# Patient Record
Sex: Female | Born: 1965 | Hispanic: Yes | Marital: Single | Smoking: Never smoker
Health system: Southern US, Community
[De-identification: ages and names within clinical notes are randomized; demographics above are authoritative.]

## PROBLEM LIST (undated history)

## (undated) DIAGNOSIS — E079 Disorder of thyroid, unspecified: Secondary | ICD-10-CM

## (undated) HISTORY — PX: ABDOMINAL HYSTERECTOMY: SHX81

---

## 2018-02-13 ENCOUNTER — Encounter: Payer: Self-pay | Admitting: Emergency Medicine

## 2018-02-13 ENCOUNTER — Emergency Department
Admission: EM | Admit: 2018-02-13 | Discharge: 2018-02-13 | Disposition: A | Discharge status: Home / Self Care | Payer: Self-pay | Attending: Emergency Medicine | Admitting: Emergency Medicine

## 2018-02-13 ENCOUNTER — Emergency Department: Payer: Self-pay

## 2018-02-13 ENCOUNTER — Other Ambulatory Visit: Payer: Self-pay

## 2018-02-13 DIAGNOSIS — K529 Noninfective gastroenteritis and colitis, unspecified: Secondary | ICD-10-CM | POA: Insufficient documentation

## 2018-02-13 HISTORY — DX: Disorder of thyroid, unspecified: E07.9

## 2018-02-13 LAB — COMPREHENSIVE METABOLIC PANEL
ALT: 37 U/L (ref 0–44)
AST: 25 U/L (ref 15–41)
Albumin: 4.4 g/dL (ref 3.5–5.0)
Alkaline Phosphatase: 53 U/L (ref 38–126)
Anion gap: 9 (ref 5–15)
BUN: 9 mg/dL (ref 6–20)
CO2: 22 mmol/L (ref 22–32)
Calcium: 9.1 mg/dL (ref 8.9–10.3)
Chloride: 104 mmol/L (ref 98–111)
Creatinine, Ser: 0.58 mg/dL (ref 0.44–1.00)
GFR calc Af Amer: >60 mL/min (ref >60)
GFR calc non Af Amer: >60 mL/min (ref >60)
Glucose, Bld: 111 mg/dL — ABNORMAL HIGH (ref 70–99)
Potassium: 3.5 mmol/L (ref 3.5–5.1)
Sodium: 135 mmol/L (ref 135–145)
Total Bilirubin: 0.5 mg/dL (ref 0.3–1.2)
Total Protein: 8.1 g/dL (ref 6.5–8.1)

## 2018-02-13 LAB — URINALYSIS, COMPLETE (UACMP) WITH MICROSCOPIC
Bacteria, UA: NONE SEEN
Bilirubin Urine: NEGATIVE
Glucose, UA: NEGATIVE mg/dL
KETONES UR: NEGATIVE mg/dL
LEUKOCYTES UA: NEGATIVE
Nitrite: NEGATIVE
Protein, ur: NEGATIVE mg/dL
Specific Gravity, Urine: 1.012 (ref 1.005–1.030)
pH: 6 (ref 5.0–8.0)

## 2018-02-13 LAB — CBC
HCT: 40.8 % (ref 36.0–46.0)
Hemoglobin: 13.8 g/dL (ref 12.0–15.0)
MCH: 31.4 pg (ref 26.0–34.0)
MCHC: 33.8 g/dL (ref 30.0–36.0)
MCV: 92.9 fL (ref 80.0–100.0)
NRBC: 0 % (ref 0.0–0.2)
Platelets: 227 10*3/uL (ref 150–400)
RBC: 4.39 MIL/uL (ref 3.87–5.11)
RDW: 11.8 % (ref 11.5–15.5)
WBC: 5.8 10*3/uL (ref 4.0–10.5)

## 2018-02-13 LAB — LIPASE, BLOOD: Lipase: 25 U/L (ref 11–51)

## 2018-02-13 MED ORDER — IOPAMIDOL (ISOVUE-300) INJECTION 61%
100.0000 mL | Freq: Once | INTRAVENOUS | Status: AC | PRN
  Administered 2018-02-13: 100 mL via INTRAVENOUS
  Filled 2018-02-13: qty 100

## 2018-02-13 MED ORDER — SODIUM CHLORIDE 0.9 % IV BOLUS
1000.0000 mL | Freq: Once | INTRAVENOUS | Status: AC
  Administered 2018-02-13: 1000 mL via INTRAVENOUS

## 2018-02-13 MED ORDER — DICYCLOMINE HCL 10 MG PO CAPS: 10.0000 mg | ORAL_CAPSULE | Freq: Three times a day (TID) | ORAL | 0 refills | Status: AC

## 2018-02-13 MED ORDER — ONDANSETRON HCL 4 MG/2ML IJ SOLN
4.0000 mg | Freq: Once | INTRAMUSCULAR | Status: AC
  Administered 2018-02-13: 4 mg via INTRAVENOUS
  Filled 2018-02-13: qty 2

## 2018-02-13 MED ORDER — DICYCLOMINE HCL 10 MG/ML IM SOLN
20.0000 mg | Freq: Once | INTRAMUSCULAR | Status: AC
  Administered 2018-02-13: 20 mg via INTRAMUSCULAR
  Filled 2018-02-13: qty 2

## 2018-02-13 MED ORDER — IOPAMIDOL (ISOVUE-300) INJECTION 61%
30.0000 mL | Freq: Once | INTRAVENOUS | Status: AC
  Administered 2018-02-13: 30 mL via ORAL
  Filled 2018-02-13: qty 30

## 2018-02-13 MED ORDER — FENTANYL CITRATE (PF) 100 MCG/2ML IJ SOLN
50.0000 ug | Freq: Once | INTRAMUSCULAR | Status: AC
  Administered 2018-02-13: 50 ug via INTRAVENOUS
  Filled 2018-02-13: qty 2

## 2018-02-13 MED ORDER — ONDANSETRON HCL 4 MG PO TABS: 4.0000 mg | ORAL_TABLET | Freq: Three times a day (TID) | ORAL | 0 refills | Status: AC | PRN

## 2018-02-13 NOTE — ED Provider Notes (Signed)
[facility] Emergency Department Provider Note  ____________________________________________   I have reviewed the triage vital signs and the nursing notes. Where available I have reviewed prior notes and, if possible and indicated, outside hospital notes.    HISTORY  Chief Complaint Abdominal Pain    HPI Jasmine SimmersMaria Roman is a 53 y.o. female who presents today complaining of nausea vomiting diarrhea for 3 days.  She states she has a crampy discomfort in her abdomen and feels gas pains.  She denies any fever or chills.  No melena or bright red blood per rectum no hematemesis.  Patient speaks Spanish, she is very comfortable with my Spanish she does not want me to call for an interpreter at this time, she states that she changes her mind she will let me know.  She feels that there are no barriers to communication between us at this time patient denies any recent travel or antibiotics.  Is any focal abdominal pain.  She is taking Gatorade at home but no other medications.  No other alleviating or aggravating symptoms. History of hysterectomy in the past no other surgical history.    Past Medical History:  Diagnosis Date  . Thyroid disease     There are no active problems to display for this patient.   Past Surgical History:  Procedure Laterality Date  . ABDOMINAL HYSTERECTOMY      Prior to Admission medications   Not on File    Allergies Penicillins  No family history on file.  Social History Social History   Tobacco Use  . Smoking status: Never Smoker  . Smokeless tobacco: Never Used  Substance Use Topics  . Alcohol use: Not Currently  . Drug use: Never    Review of Systems Constitutional: No fever/chills Eyes: No visual changes. ENT: No sore throat. No stiff neck no neck pain Cardiovascular: Denies chest pain. Respiratory: Denies shortness of breath. Gastrointestinal: See HPI genitourinary: Negative for dysuria. Musculoskeletal: Negative  lower extremity swelling Skin: Negative for rash. Neurological: Negative for severe headaches, focal weakness or numbness.   ____________________________________________   PHYSICAL EXAM:  VITAL SIGNS: ED Triage Vitals  Enc Vitals Group     BP 02/13/18 1216 (!) 130/97     Pulse Rate 02/13/18 1216 78     Resp 02/13/18 1216 (!) 22     Temp 02/13/18 1216 97.6 F (36.4 C)     Temp Source 02/13/18 1216 Oral     SpO2 02/13/18 1216 98 %     Weight 02/13/18 1215 140 lb (63.5 kg)     Height 02/13/18 1215 5\' 3"  (1.6 m)     Head Circumference --      Peak Flow --      Pain Score 02/13/18 1225 9     Pain Loc --      Pain Edu? --      Excl. in GC? --     Constitutional: Alert and oriented. Well appearing and in no acute distress. Eyes: Conjunctivae are normal Head: Atraumatic HEENT: No congestion/rhinnorhea. Mucous membranes are moist.  Oropharynx non-erythematous Neck:   Nontender with no meningismus, no masses, no stridor Cardiovascular: Normal rate, regular rhythm. Grossly normal heart sounds.  Good peripheral circulation. Respiratory: Normal respiratory effort.  No retractions. Lungs CTAB. Abdominal: Soft and diffuse discomfort nothing focal at this time. . No guarding no rebound Back:  There is no focal tenderness or step off.  there is no midline tenderness there are no lesions noted. there is no  CVA tenderness Musculoskeletal: No lower extremity tenderness, no upper extremity tenderness. No joint effusions, no DVT signs strong distal pulses no edema Neurologic:  Normal speech and language. No gross focal neurologic deficits are appreciated.  Skin:  Skin is warm, dry and intact. No rash noted. Psychiatric: Mood and affect are normal. Speech and behavior are normal.  ____________________________________________   LABS (all labs ordered are listed, but only abnormal results are displayed)  Labs Reviewed  COMPREHENSIVE METABOLIC PANEL - Abnormal; Notable for the following  components:      Result Value   Glucose, Bld 111 (*)    All other components within normal limits  URINALYSIS, COMPLETE (UACMP) WITH MICROSCOPIC - Abnormal; Notable for the following components:   Color, Urine YELLOW (*)    APPearance CLEAR (*)    Hgb urine dipstick SMALL (*)    All other components within normal limits  LIPASE, BLOOD  CBC    Pertinent labs  results that were available during my care of the patient were reviewed by me and considered in my medical decision making (see chart for details). ____________________________________________  EKG  I personally interpreted any EKGs ordered by me or triage  ____________________________________________  RADIOLOGY  Pertinent labs & imaging results that were available during my care of the patient were reviewed by me and considered in my medical decision making (see chart for details). If possible, patient and/or family made aware of any abnormal findings.  No results found. ____________________________________________    PROCEDURES  Procedure(s) performed: None  Procedures  Critical Care performed: None  ____________________________________________   INITIAL IMPRESSION / ASSESSMENT AND PLAN / ED COURSE  Pertinent labs & imaging results that were available during my care of the patient were reviewed by me and considered in my medical decision making (see chart for details).  Here with nausea vomiting diarrhea, blood work is normal despite 3 days of symptoms no evidence of dehydration, low suspicion for obstruction, low suspicion for appendicitis low suspicion for gallbladder disease etc.  We will obtain a screening x-ray to make sure that there is no obstructive pattern and that I do not think a CT scan is necessary indicated we will treat her symptoms at this time and we will reassess. She is very comfortable with this plan.  she would like a work note.   ____________________________________________   FINAL  CLINICAL IMPRESSION(S) / ED DIAGNOSES  Final diagnoses:  None      This chart was dictated using voice recognition software.  Despite best efforts to proofread,  errors can occur which can change meaning.      Jeanmarie Plant, MD 02/13/18 [phone]

## 2018-02-13 NOTE — ED Triage Notes (Signed)
Pt c/o abd pain and vomiting/diarrhea xfew days . Language barrier, Interpreter used. VSS

## 2018-02-26 ENCOUNTER — Ambulatory Visit: Payer: Self-pay | Attending: Oncology | Admitting: *Deleted

## 2018-02-26 ENCOUNTER — Encounter: Payer: Self-pay | Admitting: *Deleted

## 2018-02-26 ENCOUNTER — Ambulatory Visit
Admission: RE | Admit: 2018-02-26 | Discharge: 2018-02-26 | Disposition: A | Discharge status: Home / Self Care | Payer: Medicaid Other | Source: Ambulatory Visit | Attending: Oncology | Admitting: Oncology

## 2018-02-26 VITALS — BP 153/85 | HR 89 | Temp 98.0°F | Ht 64.75 in | Wt 170.5 lb

## 2018-02-26 DIAGNOSIS — Z0000 Encounter for general adult medical examination without abnormal findings: Secondary | ICD-10-CM

## 2018-02-26 NOTE — Progress Notes (Signed)
  Subjective:     Patient ID: Jasmine Roman, female   DOB: 1965-06-15, 53 y.o.   MRN: [id number]  HPI   Review of Systems     Objective:   Physical Exam Chest:     Breasts:        Right: No swelling, bleeding, inverted nipple, mass, nipple discharge, skin change or tenderness.        Left: No swelling, bleeding, inverted nipple, mass, nipple discharge, skin change or tenderness.          Assessment:     53 year old [place] female referred to [facility] by [facility] for clinical breast exam and mammogram.  [place], the interpreter present during the interview and exam.  Clinical breast exam unremarkable.  Taught self breast awareness.  Patient with a history or hysterectomy for fibroids.  No pap per [facility] protocol. Patient has been screened for eligibility.  She does not have any insurance, Medicare or Medicaid.  She also meets financial eligibility.  Hand-out given on the Affordable Care Act.  Risk Assessment    Risk Scores      02/26/2018   Last edited by: Alta Corning, CMA   5-year risk: 0.7 %   Lifetime risk: 6.3 %            Plan:     Screening mammogram ordered.  Will follow-up per [facility] protocol.

## 2018-02-26 NOTE — Patient Instructions (Signed)
Gave patient hand-out, Women Staying Healthy, Active and Well from [facility], with education on breast health, pap smears, heart and colon health. 

## 2018-03-03 ENCOUNTER — Encounter: Payer: Self-pay | Admitting: *Deleted

## 2018-03-03 NOTE — Progress Notes (Signed)
Letter mailed from [facility] to inform patient of her normal mammogram results.  Patient is to follow-up with annual screening in one year.  HSIS to Christy. 

## 2020-10-30 IMAGING — CT CT ABD-PELV W/ CM
2 of 5 series · 16 of 46 positions shown, 18 images · IV contrast (APPLIED)
Comparison: 02/13/2018

CLINICAL DATA: Abdominal pain with vomiting

EXAM:
CT ABDOMEN AND PELVIS WITH CONTRAST
TECHNIQUE: Multidetector CT imaging of the abdomen and pelvis was performed
using the standard protocol following bolus administration of
intravenous contrast.
CONTRAST:  100mL CN1OG9-4II IOPAMIDOL (CN1OG9-4II) INJECTION 61%

[Series 2: routine abd/pel with · axial · 0.81mm/px · z∈[-931,-511]mm · 13 of 96 slices shown, 15 images]
[im 6/96  soft-tissue]
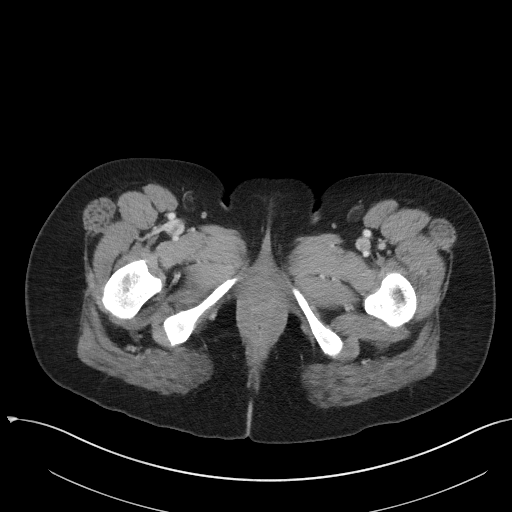
[im 6/96  bone]
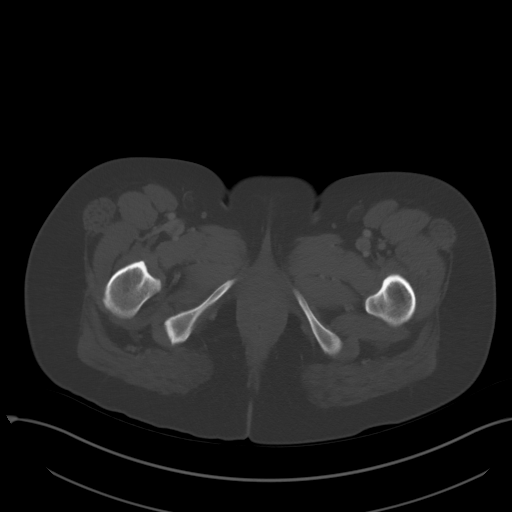
[im 11/96  soft-tissue]
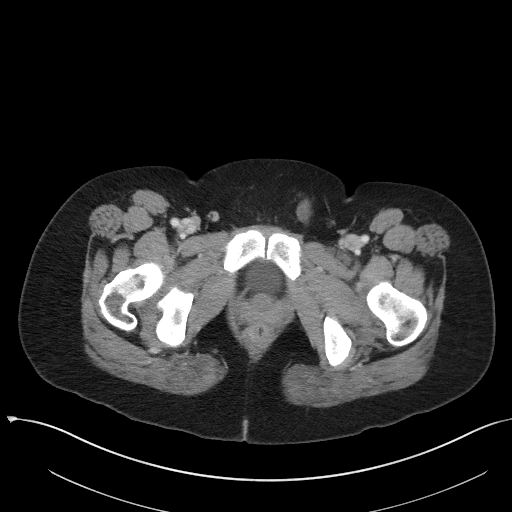
[im 22/96  soft-tissue]
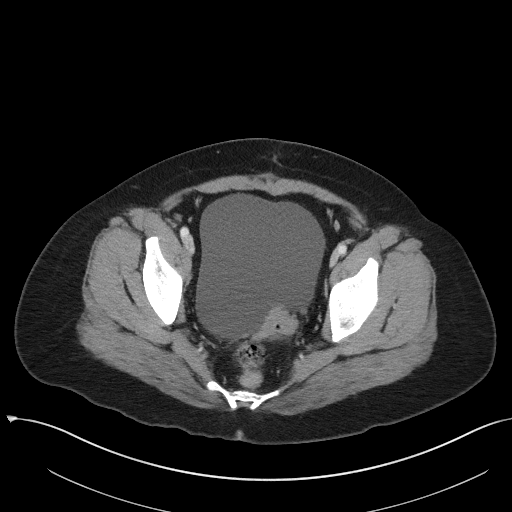
[im 27/96  soft-tissue]
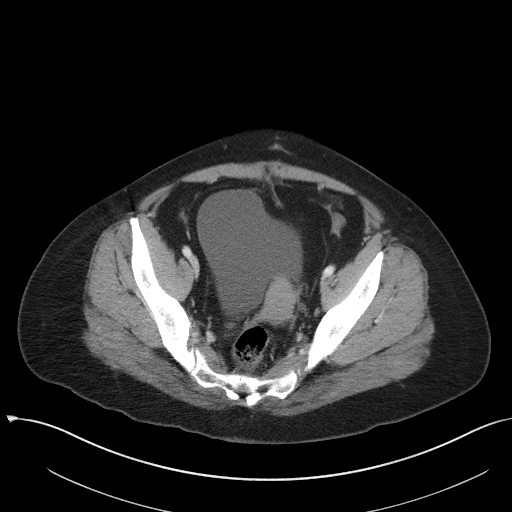
[im 32/96  soft-tissue]
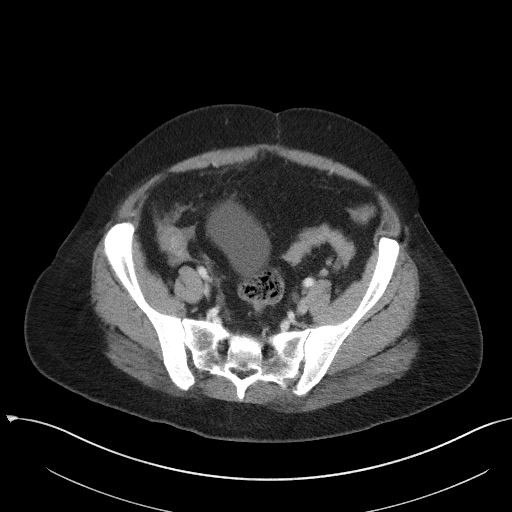
[im 43/96  soft-tissue]
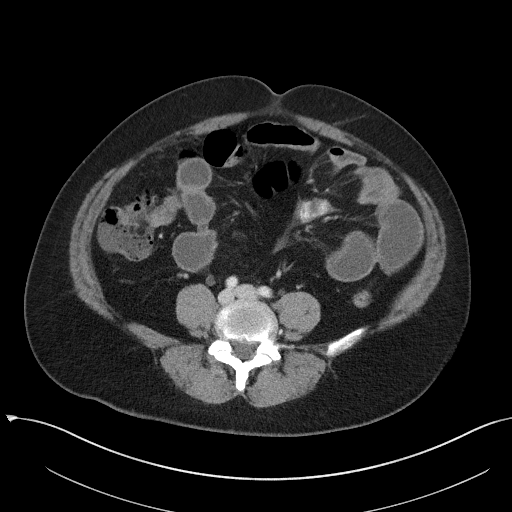
[im 48/96  soft-tissue]
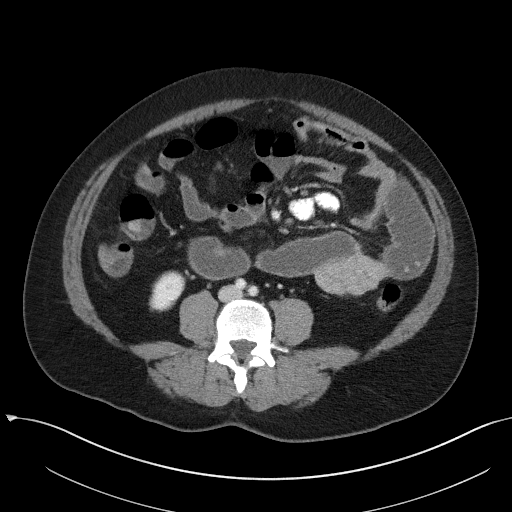
[im 53/96  soft-tissue]
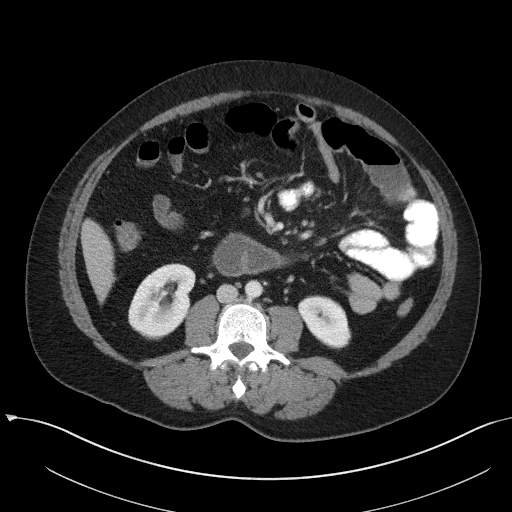
[im 64/96  soft-tissue]
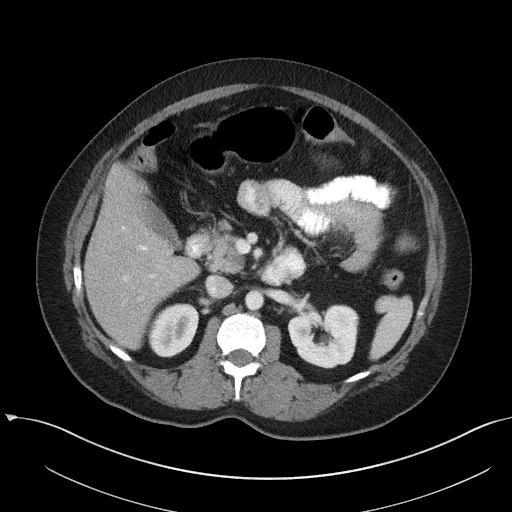
[im 64/96  bone]
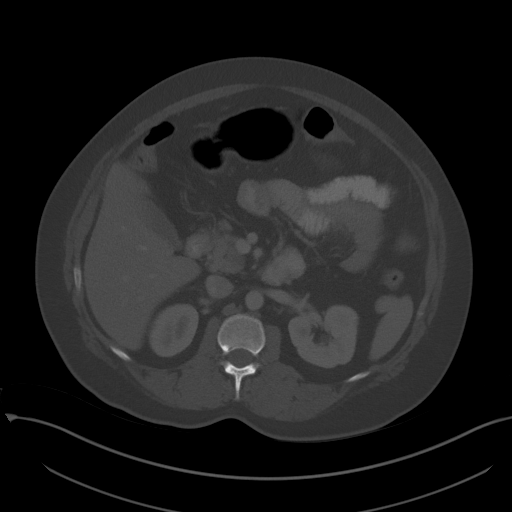
[im 69/96  soft-tissue]
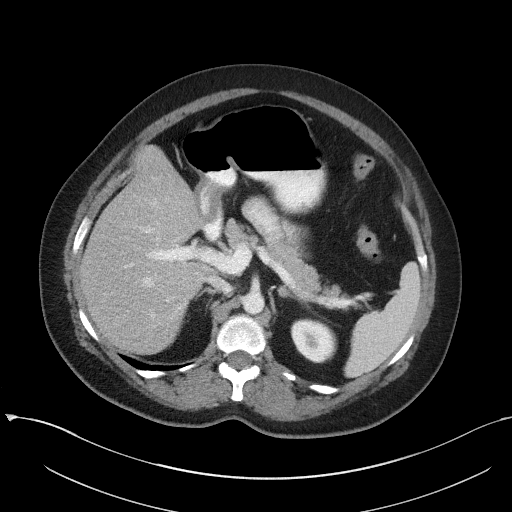
[im 74/96  soft-tissue]
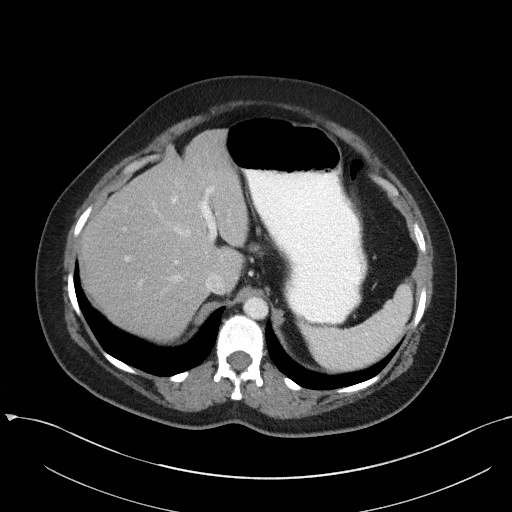
[im 85/96  soft-tissue]
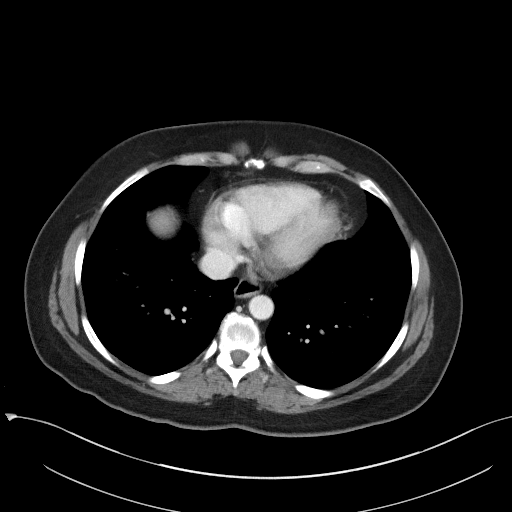
[im 90/96  soft-tissue]
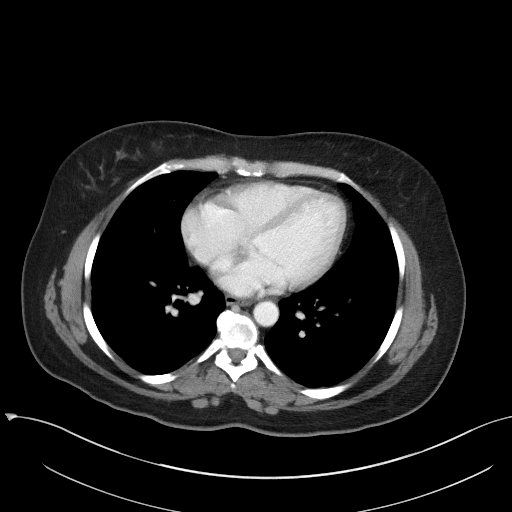

[Series 5: coronal st · coronal · 0.78mm/px · 3 of 101 slices shown]
[im 34/101  soft-tissue]
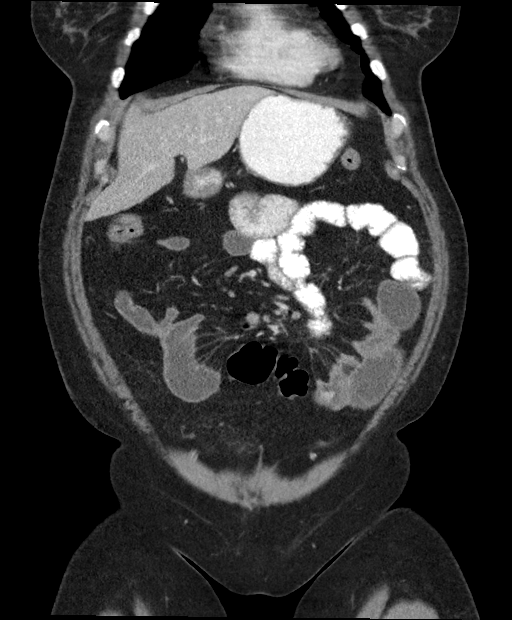
[im 45/101  soft-tissue]
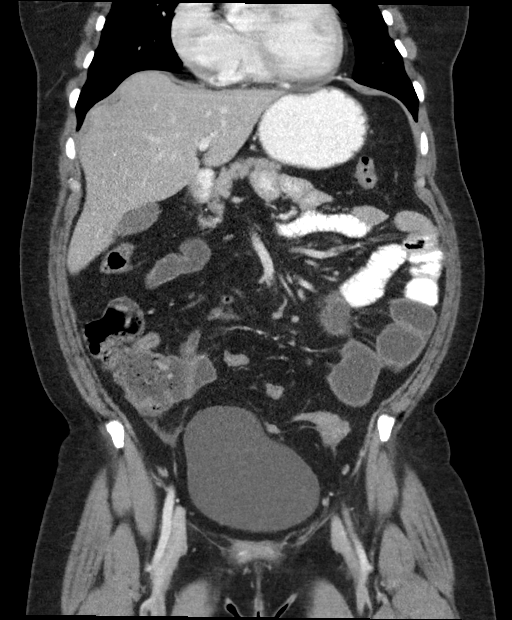
[im 56/101  soft-tissue]
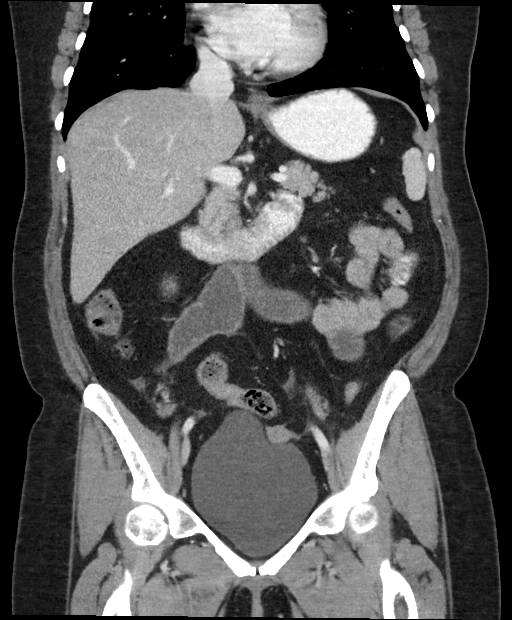

[16 of 46 positions shown; findings below may reference images not displayed]

FINDINGS: Lower chest: Lung bases demonstrate no acute consolidation or
effusion. The heart size is normal.

Hepatobiliary: No focal liver abnormality is seen. No gallstones,
gallbladder wall thickening, or biliary dilatation.

Pancreas: Unremarkable. No pancreatic ductal dilatation or
surrounding inflammatory changes.

Spleen: Normal in size without focal abnormality.

Adrenals/Urinary Tract: Adrenal glands are unremarkable. Kidneys are
normal, without renal calculi, focal lesion, or hydronephrosis.
Bladder is unremarkable.

Stomach/Bowel: Mildly dilated stomach. Contrast opacification of
nondilated proximal small bowel. Mid to distal fluid-filled
unopacified small bowel loops, measuring up to 3.5 cm with mild
mucosal enhancement. Mild left lower quadrant small bowel wall
thickening. No well-defined point of transition. No colon wall
thickening.

Vascular/Lymphatic: Nonaneurysmal aorta. No significantly enlarged
lymph nodes.

Reproductive: At least partial hysterectomy.  No adnexal masses.

Other: Negative for free air or free fluid.

Musculoskeletal: No acute or significant osseous findings.
IMPRESSION: 1. Fluid-filled mildly distended mid to distal small bowel loops
with mild mucosal enhancement and slight thickening of small bowel
loops in the left mid to lower abdomen.. No well-defined point of
transition, favor focal ileus/enteritis over partial obstruction.
2. There are no other acute or significant abnormalities.

## 2020-10-30 IMAGING — CR DG ABDOMEN ACUTE W/ 1V CHEST
4 series · 4 of 4 positions shown · non-contrast
Comparison: None.

CLINICAL DATA: Nausea, vomiting.

EXAM:
DG ABDOMEN ACUTE W/ 1V CHEST

[chest pa]
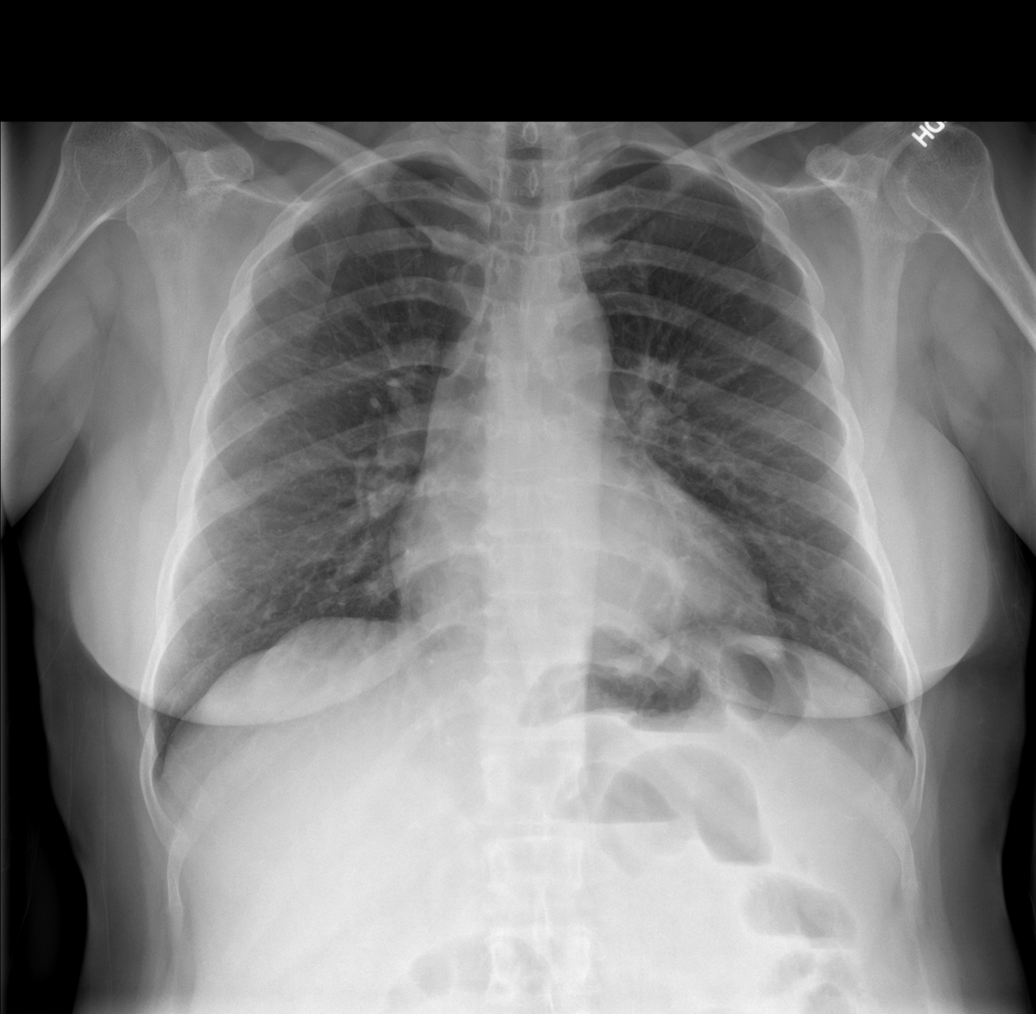

[abdomen erect]
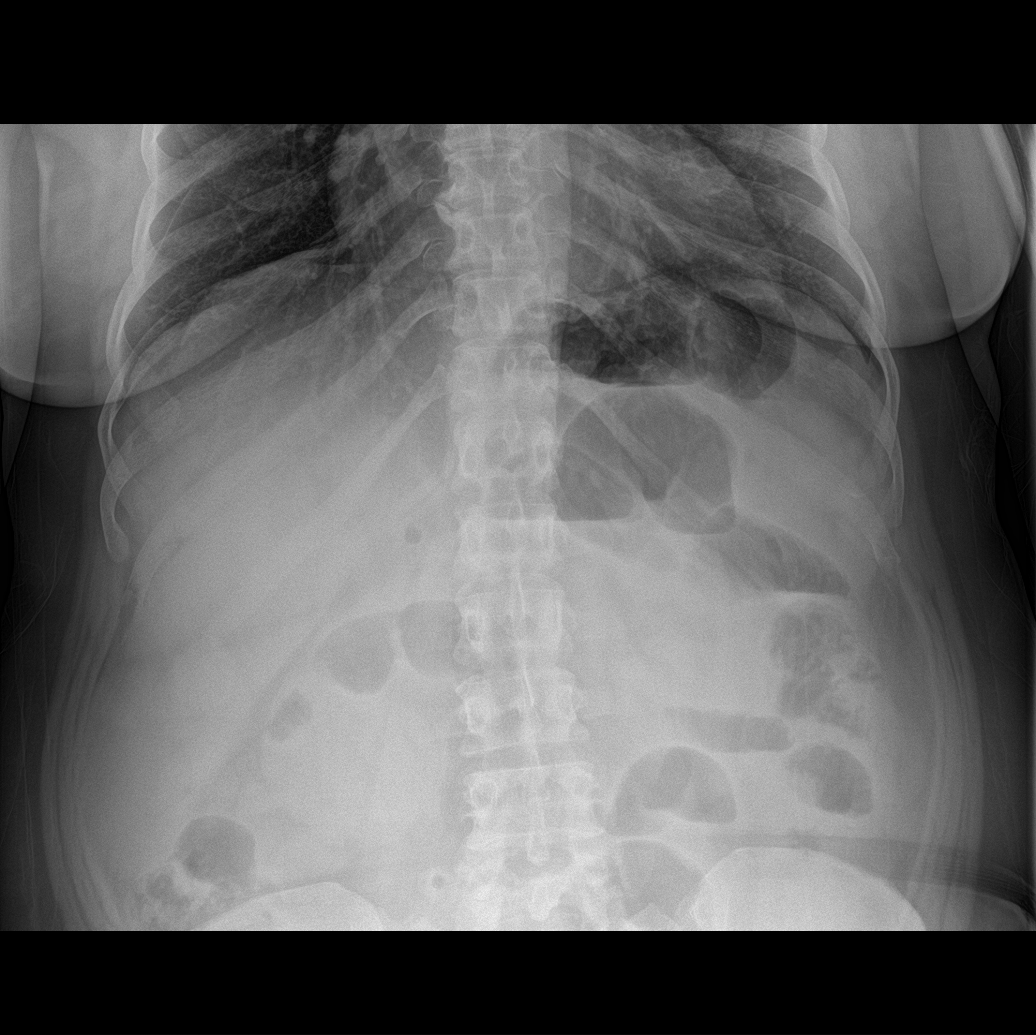

[abdomen kub (1 of 2)]
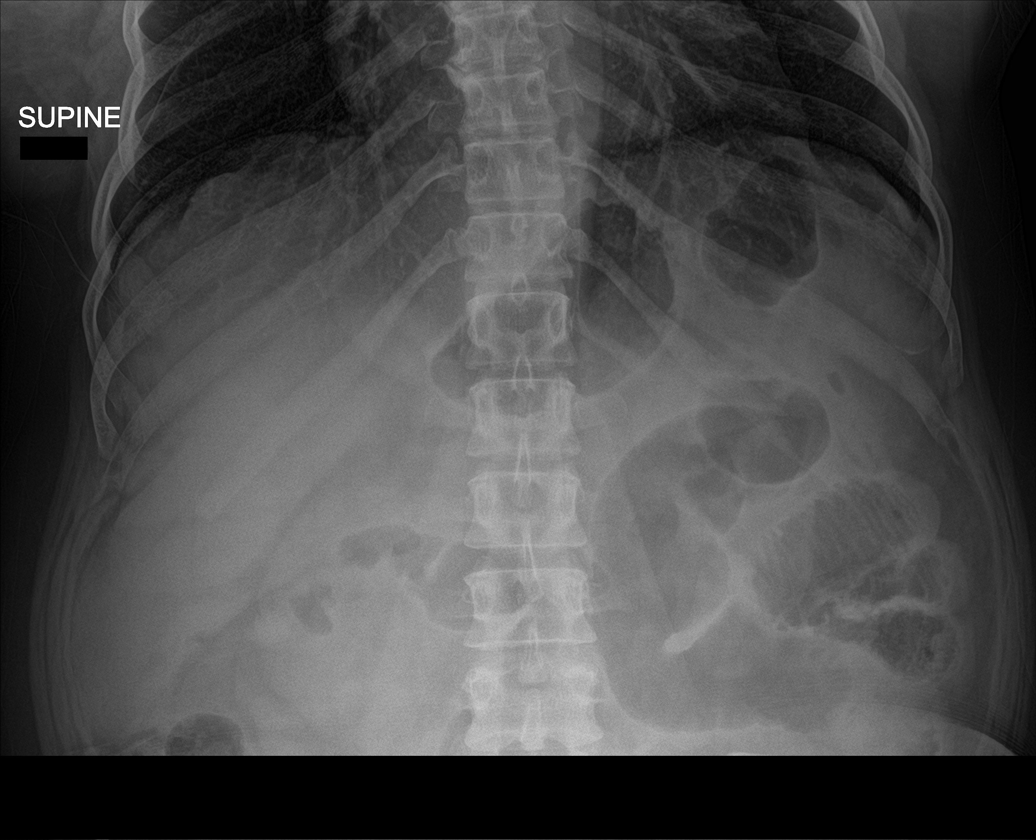

[abdomen kub (2 of 2)]
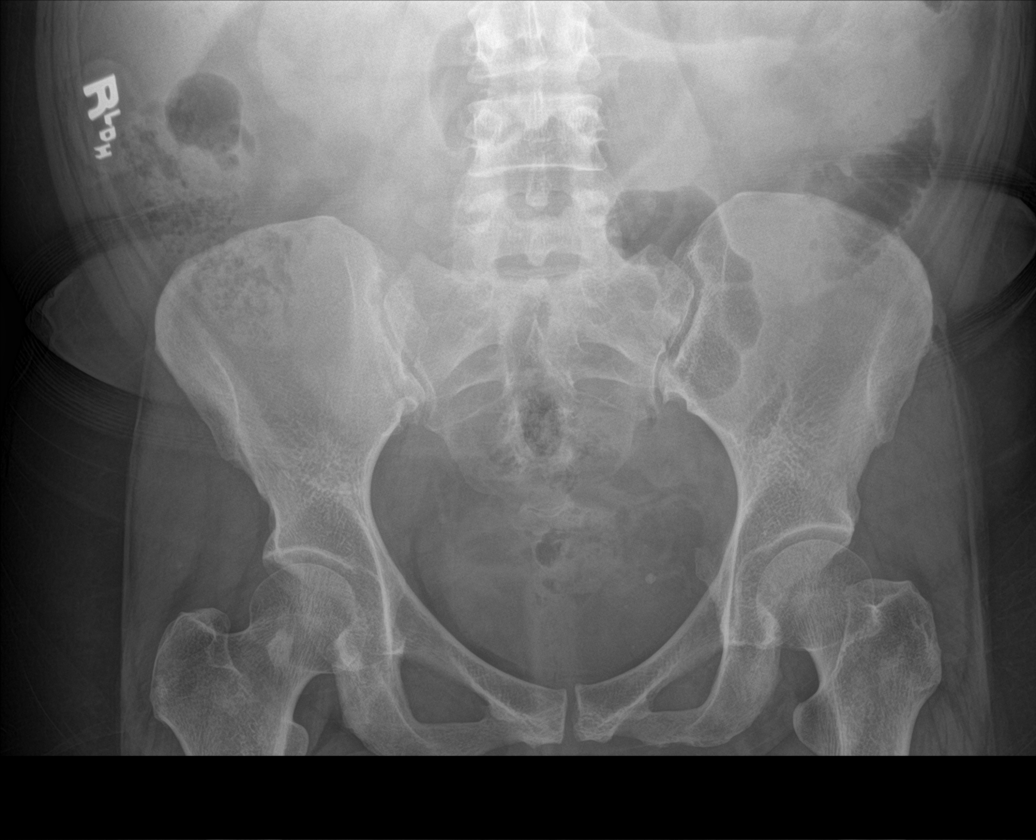

[4 of 4 positions shown; findings below may reference images not displayed]

FINDINGS: Mildly dilated small bowel loops are seen in the left side of the
abdomen which may represent ileus or possibly distal small bowel
obstruction. No colonic dilatation is noted. Phlebolith is noted in
the pelvis. Heart size and mediastinal contours are within normal
limits. Both lungs are clear.
IMPRESSION: Mildly dilated small bowel loops are seen in the left side of the
abdomen which may represent ileus or possibly distal small bowel
obstruction. No acute cardiopulmonary disease.
